# Patient Record
Sex: Female | Born: 1985 | Race: White | Hispanic: No | Marital: Married | Smoking: Never smoker
Health system: Southern US, Community
[De-identification: ages and names within clinical notes are randomized; demographics above are authoritative.]

---

## 2018-02-08 ENCOUNTER — Ambulatory Visit (HOSPITAL_BASED_OUTPATIENT_CLINIC_OR_DEPARTMENT_OTHER)
Admit: 2018-02-08 | Discharge: 2018-02-08 | Disposition: A | Discharge status: Home / Self Care | Payer: 59 | Source: Ambulatory Visit | Attending: Emergency Medicine | Admitting: Emergency Medicine

## 2018-02-08 ENCOUNTER — Encounter: Payer: Self-pay | Admitting: Emergency Medicine

## 2018-02-08 ENCOUNTER — Emergency Department
Admission: EM | Admit: 2018-02-08 | Discharge: 2018-02-08 | Disposition: A | Discharge status: Home / Self Care | Payer: 59 | Source: Home / Self Care | Attending: Emergency Medicine | Admitting: Emergency Medicine

## 2018-02-08 ENCOUNTER — Other Ambulatory Visit: Payer: Self-pay

## 2018-02-08 DIAGNOSIS — N912 Amenorrhea, unspecified: Secondary | ICD-10-CM | POA: Diagnosis not present

## 2018-02-08 DIAGNOSIS — M79605 Pain in left leg: Secondary | ICD-10-CM | POA: Diagnosis not present

## 2018-02-08 DIAGNOSIS — M79662 Pain in left lower leg: Secondary | ICD-10-CM | POA: Insufficient documentation

## 2018-02-08 LAB — POCT URINE PREGNANCY: Preg Test, Ur: NEGATIVE

## 2018-02-08 NOTE — ED Provider Notes (Signed)
[facility]    CSN: [id number] Arrival date & time: 02/08/18  1054     History   Chief Complaint Chief Complaint  Patient presents with  . Knee Injury  Chief complaint: Left posterior knee and calf pain for 2 weeks  HPI Aiya Keach is a 32 y.o. female.   HPI Left posterior knee and left calf pain for 2 weeks. She is concerned that she might have DVT. Pain is tight type of pain, 3 out of 5, can worsen to 5 out of 10 when trying to weight bear. First started when she accidentally slipped and fell 4 weeks ago, January 22. No loss of consciousness or cardiorespiratory or focal neurologic symptoms. She had small bruise right leg, but that is resolving and denies any right leg pain or symptoms. She had mild left calf pain after the injury, but that has gradually worsened over the past 4 weeks. Risk factors for DVT:  - Injury 4 weeks ago - Had flu 2 weeks ago, and was laying in bed for many days while recovering. - Two long car trips the past 2 weekends.  - She uses progesterone cream as prescribed by PCP.  Associated symptoms: Denies fever or chills or chest pain or shortness of breath. No abdominal pain or nausea or vomiting or diarrhea. No rash.  LMP 01/11/2018   History reviewed. No pertinent past medical history. Denies chronic disease. Denies history of DVT in the past There are no active problems to display for this patient.   History reviewed. No pertinent surgical history.  OB History    No data available       Home Medications    Prior to Admission medications   Medication Sig Start Date End Date Taking? Authorizing Provider  progesterone (ENDOMETRIN) 100 MG vaginal insert Place 100 mg vaginally 2 (two) times daily.   Yes [provider]    Family History History reviewed. No pertinent family history. Family history positive for hypertension and "cancer"  Social History Social History   Tobacco Use  . Smoking status: Never  Smoker  . Smokeless tobacco: Never Used  Substance Use Topics  . Alcohol use: Yes  . Drug use: No     Allergies   Patient has no known allergies.   Review of Systems Review of Systems  All other systems reviewed and are negative.  no GU or GYN symptoms Please see history of present illness  Physical Exam Triage Vital Signs ED Triage Vitals  Enc Vitals Group     BP 02/08/18 1125 115/76     Pulse Rate 02/08/18 1125 83     Resp --      Temp 02/08/18 1125 97.8 F (36.6 C)     Temp Source 02/08/18 1125 Oral     SpO2 02/08/18 1125 98 %     Weight 02/08/18 1126 197 lb (89.4 kg)     Height 02/08/18 1126 5\' 4"  (1.626 m)     Head Circumference --      Peak Flow --      Pain Score 02/08/18 1126 5     Pain Loc --      Pain Edu? --      Excl. in GC? --    No data found.  Updated Vital Signs BP 115/76 (BP Location: Right Arm)   Pulse 83   Temp 97.8 F (36.6 C) (Oral)   Ht 5\' 4"  (1.626 m)   Wt 197 lb (89.4 kg)   LMP  01/11/2018 (Exact Date)   SpO2 98%   BMI 33.81 kg/m    Physical Exam  Constitutional: She is oriented to person, place, and time. She appears well-developed and well-nourished. No distress.  HENT:  Head: Normocephalic and atraumatic.  Eyes: Pupils are equal, round, and reactive to light. No scleral icterus.  Neck: Normal range of motion. Neck supple.  Cardiovascular: Normal rate and regular rhythm.  Pulmonary/Chest: Effort normal and breath sounds normal.  Abdominal: She exhibits no distension.  Musculoskeletal: Normal range of motion.  Right leg: Nontender. Resolving tiny area of ecchymosis right thigh, without any swelling or cords or heat or tenderness. Right knee exam normal. No calf tenderness.  LEFT LEG: Calf indurated, tender, minimal swelling . no definite heat or cords. No redness or red streaks. Left knee: Normal exam. No bony tenderness. Left knee functions normally. No instability. No ecchymosis or open wound. Left thigh: Nontender without  cords or swelling or heat. Neurovascular distally intact  Neurological: She is alert and oriented to person, place, and time. No cranial nerve deficit.  Skin: Skin is warm and dry. No rash noted.  Psychiatric: She has a normal mood and affect. Her behavior is normal.  Vitals reviewed.    UC Treatments / Results  Labs (all labs ordered are listed, but only abnormal results are displayed) Labs Reviewed  POCT URINE PREGNANCY  Urine pregnancy test (ordered because LMP 28 days ago)  : negative  I am concerned about possible left leg DVT. Risk factors for DVT:  - Injury 4 weeks ago - Had flu 2 weeks ago, and was laying in bed for many days while recovering. - Two long car trips the past 2 weekends.  - She uses progesterone cream as prescribed by PCP.  In our facility, we have no capability to perform venous Dopplers. Therefore, after risks benefits terms discussed, we both agree with ordering stat ultrasound left leg to rule out DVT.  An After Visit Summary was printed and given to the patient.  "Today, your urine pregnancy test is negative. Your left calf pain could be from a bruise or superficial phlebitis, however I am suspicious enough that we need to rule out DVT of your left leg. (Risk factors: Left calf pain , history of immobilization , using progesterone cream )Therefore, we made appointment [facility], 1:30 PM today, for stat ultrasound left lower extremity.  They will call us with results and we'll inform you. "   EKG  EKG Interpretation None       Radiology US Venous Img Lower Unilateral Left  Result Date: 02/08/2018 CLINICAL DATA:  32 year old with pain behind the left knee for 2 weeks. Fall 1 month ago. EXAM: LEFT LOWER EXTREMITY VENOUS DOPPLER ULTRASOUND TECHNIQUE: Gray-scale sonography with graded compression, as well as color Doppler and duplex ultrasound were performed to evaluate the lower extremity deep venous systems from the level of the  common femoral vein and including the common femoral, femoral, profunda femoral, popliteal and calf veins including the posterior tibial, peroneal and gastrocnemius veins when visible. The superficial great saphenous vein was also interrogated. Spectral Doppler was utilized to evaluate flow at rest and with distal augmentation maneuvers in the common femoral, femoral and popliteal veins. COMPARISON:  None. FINDINGS: Contralateral Common Femoral Vein: Respiratory phasicity is normal and symmetric with the symptomatic side. No evidence of thrombus. Normal compressibility. Common Femoral Vein: No evidence of thrombus. Normal compressibility, respiratory phasicity and response to augmentation. Saphenofemoral Junction: No evidence of  thrombus. Normal compressibility and flow on color Doppler imaging. Profunda Femoral Vein: No evidence of thrombus. Normal compressibility and flow on color Doppler imaging. Femoral Vein: No evidence of thrombus. Normal compressibility, respiratory phasicity and response to augmentation. Popliteal Vein: No evidence of thrombus. Normal compressibility, respiratory phasicity and response to augmentation. Calf Veins: No evidence of thrombus. Normal compressibility and flow on color Doppler imaging. Superficial Great Saphenous Vein: No evidence of thrombus. Normal compressibility. Other Findings:  None. IMPRESSION: Negative for deep venous thrombosis in left lower extremity. Electronically Signed   By: Richarda OverlieAdam  Henn M.D.   On: 02/08/2018 13:47    Procedures Procedures (including critical care time)  Medications Ordered in UC Medications - No data to display   Initial Impression / Assessment and Plan / UC Course   I am suspicious enough that we need to rule out DVT of your left leg. (Risk factors: Left calf pain , history of immobilization , using progesterone cream ) Therefore, we made appointment [facility], 1:30 PM today, for stat ultrasound left lower extremity.    They will call us with results and we'll inform you. "   I have reviewed the triage vital signs and the nursing notes.  Pertinent labs & imaging results that were available during my care of the patient were reviewed by me and considered in my medical decision making (see chart for details).     2:10 pm - I have received the left lower extremity venous Doppler ultrasound report: Negative for DVT.  I then called patient on her cell phone and informed of negative results. Advised to simply do symptomatic care with heat, OTC ibuprofen or Tylenol.  Follow-up with PCP if no better one week.  Final Clinical Impressions(s) / UC Diagnoses   Final diagnoses:  Left leg pain  Amenorrhea    ED Discharge Orders    None       Controlled Substance Prescriptions [place] Controlled Substance Registry consulted? Not Applicable   Lajean ManesMassey, David, MD 02/08/18 1416

## 2018-02-08 NOTE — Discharge Instructions (Signed)
Today, your urine pregnancy test is negative. Your left calf pain could be from a bruise or superficial phlebitis, however I am suspicious enough that we need to rule out DVT of your left leg. (Risk factors: Left calf pain , history of immobilization , using progesterone cream )Therefore, we made appointment [facility], 1:30 PM today, for stat ultrasound left lower extremity. They will call us with results and we'll inform you.

## 2018-02-08 NOTE — ED Triage Notes (Signed)
Fall one month ago pain behind LEFT knee x 1 month, not red, not hot, not swollen, patient saw her PCP 3 days ago. Patient is concerned for blood clot, PCP was not.

## 2018-02-27 ENCOUNTER — Encounter: Payer: 59 | Admitting: Obstetrics and Gynecology

## 2018-08-26 ENCOUNTER — Other Ambulatory Visit: Payer: Self-pay | Admitting: Family Medicine

## 2018-08-26 DIAGNOSIS — R519 Headache, unspecified: Secondary | ICD-10-CM

## 2018-08-26 DIAGNOSIS — R51 Headache: Principal | ICD-10-CM

## 2018-08-30 ENCOUNTER — Other Ambulatory Visit: Payer: 59

## 2018-09-04 ENCOUNTER — Ambulatory Visit: Payer: 59 | Admitting: Neurology

## 2018-10-29 ENCOUNTER — Ambulatory Visit: Payer: 59 | Admitting: Neurology

## 2018-12-02 ENCOUNTER — Other Ambulatory Visit: Payer: Self-pay | Admitting: Family Medicine

## 2018-12-02 DIAGNOSIS — N6019 Diffuse cystic mastopathy of unspecified breast: Secondary | ICD-10-CM

## 2018-12-16 ENCOUNTER — Other Ambulatory Visit: Payer: Self-pay | Admitting: Family Medicine

## 2018-12-16 DIAGNOSIS — N644 Mastodynia: Secondary | ICD-10-CM

## 2018-12-23 ENCOUNTER — Other Ambulatory Visit: Payer: Self-pay | Admitting: Family Medicine

## 2018-12-23 DIAGNOSIS — N644 Mastodynia: Secondary | ICD-10-CM

## 2018-12-23 DIAGNOSIS — N6011 Diffuse cystic mastopathy of right breast: Secondary | ICD-10-CM

## 2018-12-25 ENCOUNTER — Ambulatory Visit
Admission: RE | Admit: 2018-12-25 | Discharge: 2018-12-25 | Disposition: A | Discharge status: Home / Self Care | Payer: 59 | Source: Ambulatory Visit | Attending: Family Medicine | Admitting: Family Medicine

## 2018-12-25 DIAGNOSIS — N644 Mastodynia: Secondary | ICD-10-CM

## 2018-12-25 DIAGNOSIS — N6011 Diffuse cystic mastopathy of right breast: Secondary | ICD-10-CM

## 2019-03-15 IMAGING — US ULTRASOUND RIGHT BREAST LIMITED
1 series · 2 of 2 positions shown · non-contrast
Comparison: None.

CLINICAL DATA: Approximate 2-3 month history of cyclical breast
pain which intermittently involves both breasts. Currently, the
patient is having the greatest pain in the LOWER RIGHT breast. She
wishes not to have mammography at this time due to the breast pain.

EXAM:
ULTRASOUND OF THE RIGHT BREAST

[Series 1: ultrasound right breast limited · 0.07mm/px · 2 of 2 slices shown]
[im 1/2]
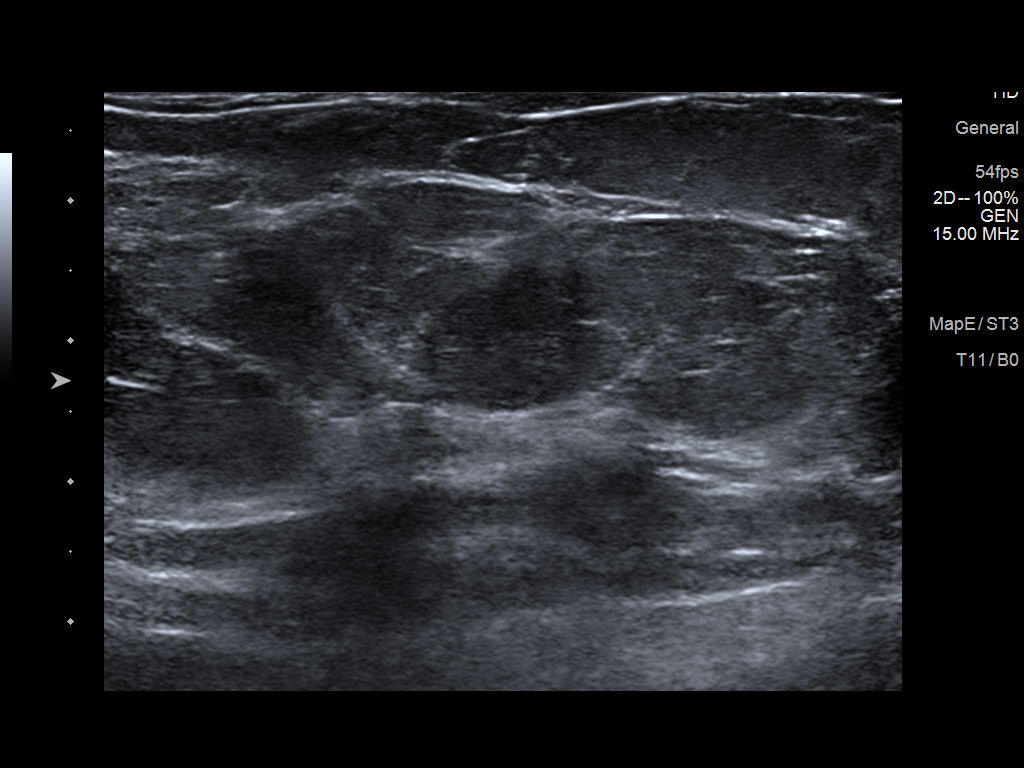
[im 2/2]
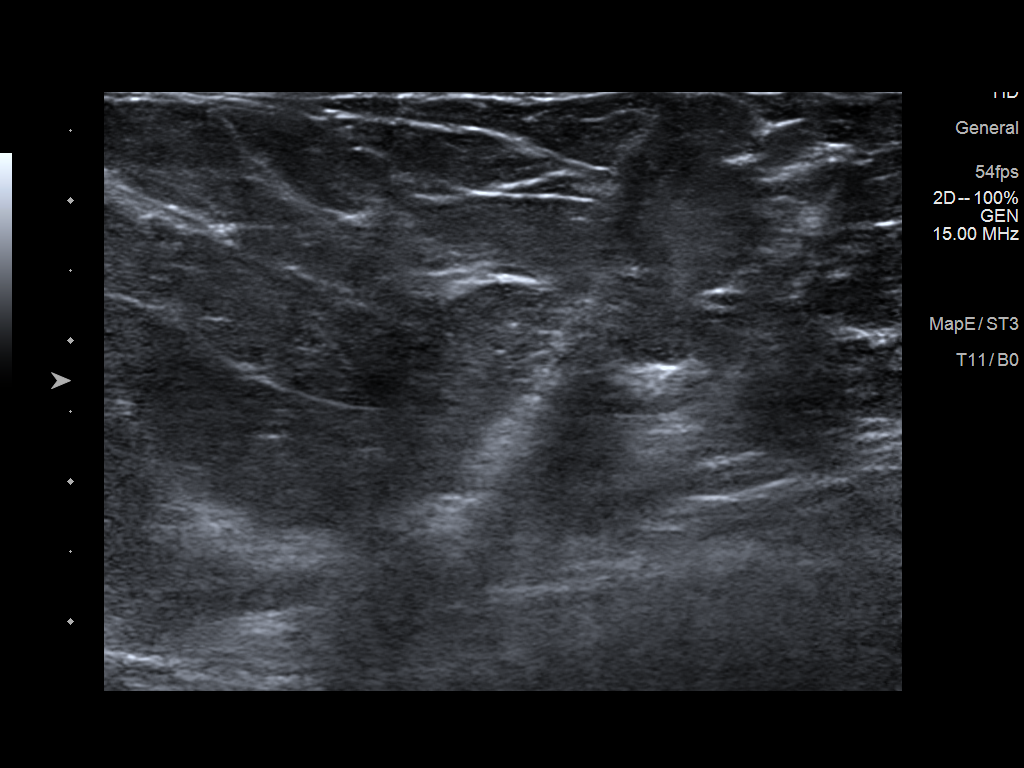

[2 of 2 positions shown; findings below may reference images not displayed]

FINDINGS: On physical exam, there is a palpable ridge of tissue in the LOWER
RIGHT breast in the area of current pain, though I do not palpate a
discrete mass.

Targeted RIGHT breast ultrasound is performed, showing normal
scattered fibroglandular tissue in the LOWER of breast in the area
of greatest current pain at the 6 o'clock position approximately 3
cm from the nipple. No cyst, solid mass or abnormal acoustic
shadowing is identified.
IMPRESSION: Normal examination.

RECOMMENDATION:
1. Screening mammogram at age 42 unless there are persistent or
intervening clinical concerns. (Code:[id number])
2. I spoke with the patient regarding the fact that mammography will
demonstrate findings that are not visible on ultrasound and is a
better modality for evaluating the entirety of the breasts. I
encouraged the patient to return for diagnostic mammography if the
pain worsens, and certainly if she were to feel a lump in either
breast.

Strategies for alleviating breast pain including decreasing caffeine
intake, vitamin-E supplementation, and avoidance of tight
compression brassieres, including underwire brassieres, were
discussed with the patient.

I have discussed the findings and recommendations with the patient.
Results were also provided in writing at the conclusion of the
visit. If applicable, a reminder letter will be sent to the patient
regarding the next appointment.

BI-RADS CATEGORY  1: Negative.
